# Patient Record
Sex: Male | Born: 1965 | Race: White | Hispanic: No | Marital: Married | Smoking: Never smoker
Health system: Southern US, Community
[De-identification: ages and names within clinical notes are randomized; demographics above are authoritative.]

---

## 2000-07-21 ENCOUNTER — Emergency Department (HOSPITAL_COMMUNITY): Admission: EM | Admit: 2000-07-21 | Discharge: 2000-07-21 | Payer: Self-pay | Admitting: Emergency Medicine

## 2000-07-21 ENCOUNTER — Encounter: Payer: Self-pay | Admitting: Emergency Medicine

## 2000-08-09 ENCOUNTER — Ambulatory Visit (HOSPITAL_COMMUNITY): Admission: RE | Admit: 2000-08-09 | Discharge: 2000-08-09 | Payer: Self-pay | Admitting: Endocrinology

## 2000-08-09 ENCOUNTER — Encounter: Payer: Self-pay | Admitting: Endocrinology

## 2019-05-25 ENCOUNTER — Emergency Department (HOSPITAL_BASED_OUTPATIENT_CLINIC_OR_DEPARTMENT_OTHER)
Admission: EM | Admit: 2019-05-25 | Discharge: 2019-05-25 | Disposition: A | Discharge status: Home / Self Care | Payer: Self-pay | Attending: Emergency Medicine | Admitting: Emergency Medicine

## 2019-05-25 ENCOUNTER — Emergency Department (HOSPITAL_BASED_OUTPATIENT_CLINIC_OR_DEPARTMENT_OTHER): Payer: Self-pay

## 2019-05-25 ENCOUNTER — Other Ambulatory Visit: Payer: Self-pay

## 2019-05-25 ENCOUNTER — Encounter (HOSPITAL_BASED_OUTPATIENT_CLINIC_OR_DEPARTMENT_OTHER): Payer: Self-pay | Admitting: Emergency Medicine

## 2019-05-25 DIAGNOSIS — T7840XA Allergy, unspecified, initial encounter: Secondary | ICD-10-CM | POA: Insufficient documentation

## 2019-05-25 DIAGNOSIS — R072 Precordial pain: Secondary | ICD-10-CM | POA: Insufficient documentation

## 2019-05-25 DIAGNOSIS — Z79899 Other long term (current) drug therapy: Secondary | ICD-10-CM | POA: Insufficient documentation

## 2019-05-25 DIAGNOSIS — R21 Rash and other nonspecific skin eruption: Secondary | ICD-10-CM | POA: Insufficient documentation

## 2019-05-25 LAB — CBC
HCT: 47.8 % (ref 39.0–52.0)
Hemoglobin: 16.1 g/dL (ref 13.0–17.0)
MCH: 30.3 pg (ref 26.0–34.0)
MCHC: 33.7 g/dL (ref 30.0–36.0)
MCV: 90 fL (ref 80.0–100.0)
Platelets: 302 10*3/uL (ref 150–400)
RBC: 5.31 MIL/uL (ref 4.22–5.81)
RDW: 12.1 % (ref 11.5–15.5)
WBC: 7.9 10*3/uL (ref 4.0–10.5)
nRBC: 0 % (ref 0.0–0.2)

## 2019-05-25 LAB — TROPONIN I (HIGH SENSITIVITY)
Troponin I (High Sensitivity): <2 ng/L (ref <18)
Troponin I (High Sensitivity): <2 ng/L (ref <18)

## 2019-05-25 LAB — BASIC METABOLIC PANEL
Anion gap: 9 (ref 5–15)
BUN: 15 mg/dL (ref 6–20)
CO2: 26 mmol/L (ref 22–32)
Calcium: 9.3 mg/dL (ref 8.9–10.3)
Chloride: 101 mmol/L (ref 98–111)
Creatinine, Ser: 0.98 mg/dL (ref 0.61–1.24)
GFR calc Af Amer: >60 mL/min (ref >60)
GFR calc non Af Amer: >60 mL/min (ref >60)
Glucose, Bld: 118 mg/dL — ABNORMAL HIGH (ref 70–99)
Potassium: 4.1 mmol/L (ref 3.5–5.1)
Sodium: 136 mmol/L (ref 135–145)

## 2019-05-25 MED ORDER — OMEPRAZOLE 20 MG PO CPDR: 20.0000 mg | DELAYED_RELEASE_CAPSULE | Freq: Every day | ORAL | 0 refills | Status: AC

## 2019-05-25 NOTE — ED Triage Notes (Signed)
Intermittent pressure to back and chest for "years" but worse the last 2 mornings. Also has hives noted to L arm and back.

## 2019-05-25 NOTE — ED Provider Notes (Signed)
[facility] DEPART[facility] Provider Note   CSN: [id number] Arrival date & time: 05/25/19  9702     History   Chief Complaint Chief Complaint  Patient presents with  . Chest Pain  . Rash    HPI Austin Booth is a 53 y.o. male.  HPI: A 53 year old patient presents for evaluation of chest pain. Initial onset of pain was less than one hour ago. The patient's chest pain is described as heaviness/pressure/tightness and is not worse with exertion. The patient's chest pain is not middle- or left-sided, is not well-localized, is not sharp and does not radiate to the arms/jaw/neck. The patient does not complain of nausea and denies diaphoresis. The patient has no history of stroke, has no history of peripheral artery disease, has not smoked in the past 90 days, denies any history of treated diabetes, has no relevant family history of coronary artery disease (first degree relative at less than age 34), is not hypertensive, has no history of hypercholesterolemia and does not have an elevated BMI (>=30).   Patient also reports intermittent rash over the past month with hives and itching.  No new exposures that the patient can think of.  He states Benadryl makes him too sleepy.  He has not tried any other medications.  He currently is without a primary care physician.  HPI  History reviewed. No pertinent past medical history.  There are no active problems to display for this patient.   Past Surgical History:  Procedure Laterality Date  . TONSILLECTOMY          Home Medications    Prior to Admission medications   Medication Sig Start Date End Date Taking? Authorizing Provider  ALPRAZolam Duanne Moron) 0.25 MG tablet Take by mouth. 04/15/19  Yes [provider]    Family History No family history on file.  Social History Social History   Tobacco Use  . Smoking status: Never Smoker  . Smokeless tobacco: Never Used  Substance Use Topics  . Alcohol use: Yes   Alcohol/week: 4.0 standard drinks    Types: 4 Cans of beer per week    Comment: daily  . Drug use: Not Currently     Allergies   Patient has no known allergies.   Review of Systems Review of Systems  All other systems reviewed and are negative.    Physical Exam Updated Vital Signs BP (!) 168/101 (BP Location: Right Arm)   Pulse 92   Temp 98.3 F (36.8 C) (Oral)   Resp 16   Ht 5\' 7"  (1.702 m)   Wt 77.1 kg   SpO2 98%   BMI 26.63 kg/m   Physical Exam Vitals signs and nursing note reviewed.  Constitutional:      Appearance: He is well-developed.  HENT:     Head: Normocephalic and atraumatic.  Neck:     Musculoskeletal: Normal range of motion.  Cardiovascular:     Rate and Rhythm: Normal rate and regular rhythm.     Heart sounds: Normal heart sounds.  Pulmonary:     Effort: Pulmonary effort is normal. No respiratory distress.     Breath sounds: Normal breath sounds.  Abdominal:     General: There is no distension.     Palpations: Abdomen is soft.     Tenderness: There is no abdominal tenderness.  Musculoskeletal: Normal range of motion.  Skin:    General: Skin is warm and dry.  Neurological:     Mental Status: He is alert and oriented  to person, place, and time.  Psychiatric:        Judgment: Judgment normal.      ED Treatments / Results  Labs (all labs ordered are listed, but only abnormal results are displayed) Labs Reviewed  BASIC METABOLIC PANEL - Abnormal; Notable for the following components:      Result Value   Glucose, Bld 118 (*)    All other components within normal limits  CBC  TROPONIN I (HIGH SENSITIVITY)  TROPONIN I (HIGH SENSITIVITY)    EKG EKG Interpretation  Date/Time:  Sunday May 25 2019 09:31:16 EDT Ventricular Rate:  92 PR Interval:    QRS Duration: 96 QT Interval:  369 QTC Calculation: 457 R Axis:   47 Text Interpretation:  Sinus rhythm Borderline T abnormalities, inferior leads No significant change was found  Confirmed by Azalia Bilisampos, Monika Chestang ([id number]) on 05/25/2019 9:58:55 AM   Radiology Dg Chest 2 View  Result Date: 05/25/2019 CLINICAL DATA:  Chest pain. EXAM: CHEST - 2 VIEW COMPARISON:  None. FINDINGS: The heart size and mediastinal contours are within normal limits. Both lungs are clear. No pneumothorax or pleural effusion is noted. The visualized skeletal structures are unremarkable. IMPRESSION: No active cardiopulmonary disease. Electronically Signed   By: Lupita RaiderJames  Green Jr M.D.   On: 05/25/2019 09:53    Procedures Procedures (including critical care time)  Medications Ordered in ED Medications - No data to display   Initial Impression / Assessment and Plan / ED Course  I have reviewed the triage vital signs and the nursing notes.  Pertinent labs & imaging results that were available during my care of the patient were reviewed by me and considered in my medical decision making (see chart for details).     HEAR Score: 1  Low suspicion for ACS.  Heart pathway utilized.  Stable for discharge with primary care follow-up.  Likely gastroesophageal reflux disease with associated esophageal spasm.  Patient be placed on daily Prilosec.  He understands return to the ER for new or worsening  symptoms  He has had intermittent rash for the past month.  I recommended follow-up with a primary care physician and allergist.  In the interim I recommended Pepcid, Benadryl, Claritin.  Final Clinical Impressions(s) / ED Diagnoses   Final diagnoses:  None    ED Discharge Orders    None       Azalia Bilisampos, Rielynn Trulson, MD 05/25/19 1210

## 2020-09-01 IMAGING — CR CHEST - 2 VIEW
2 series · 2 of 2 positions shown · non-contrast
Comparison: None.

CLINICAL DATA: Chest pain.

EXAM:
CHEST - 2 VIEW

[w chest pa]
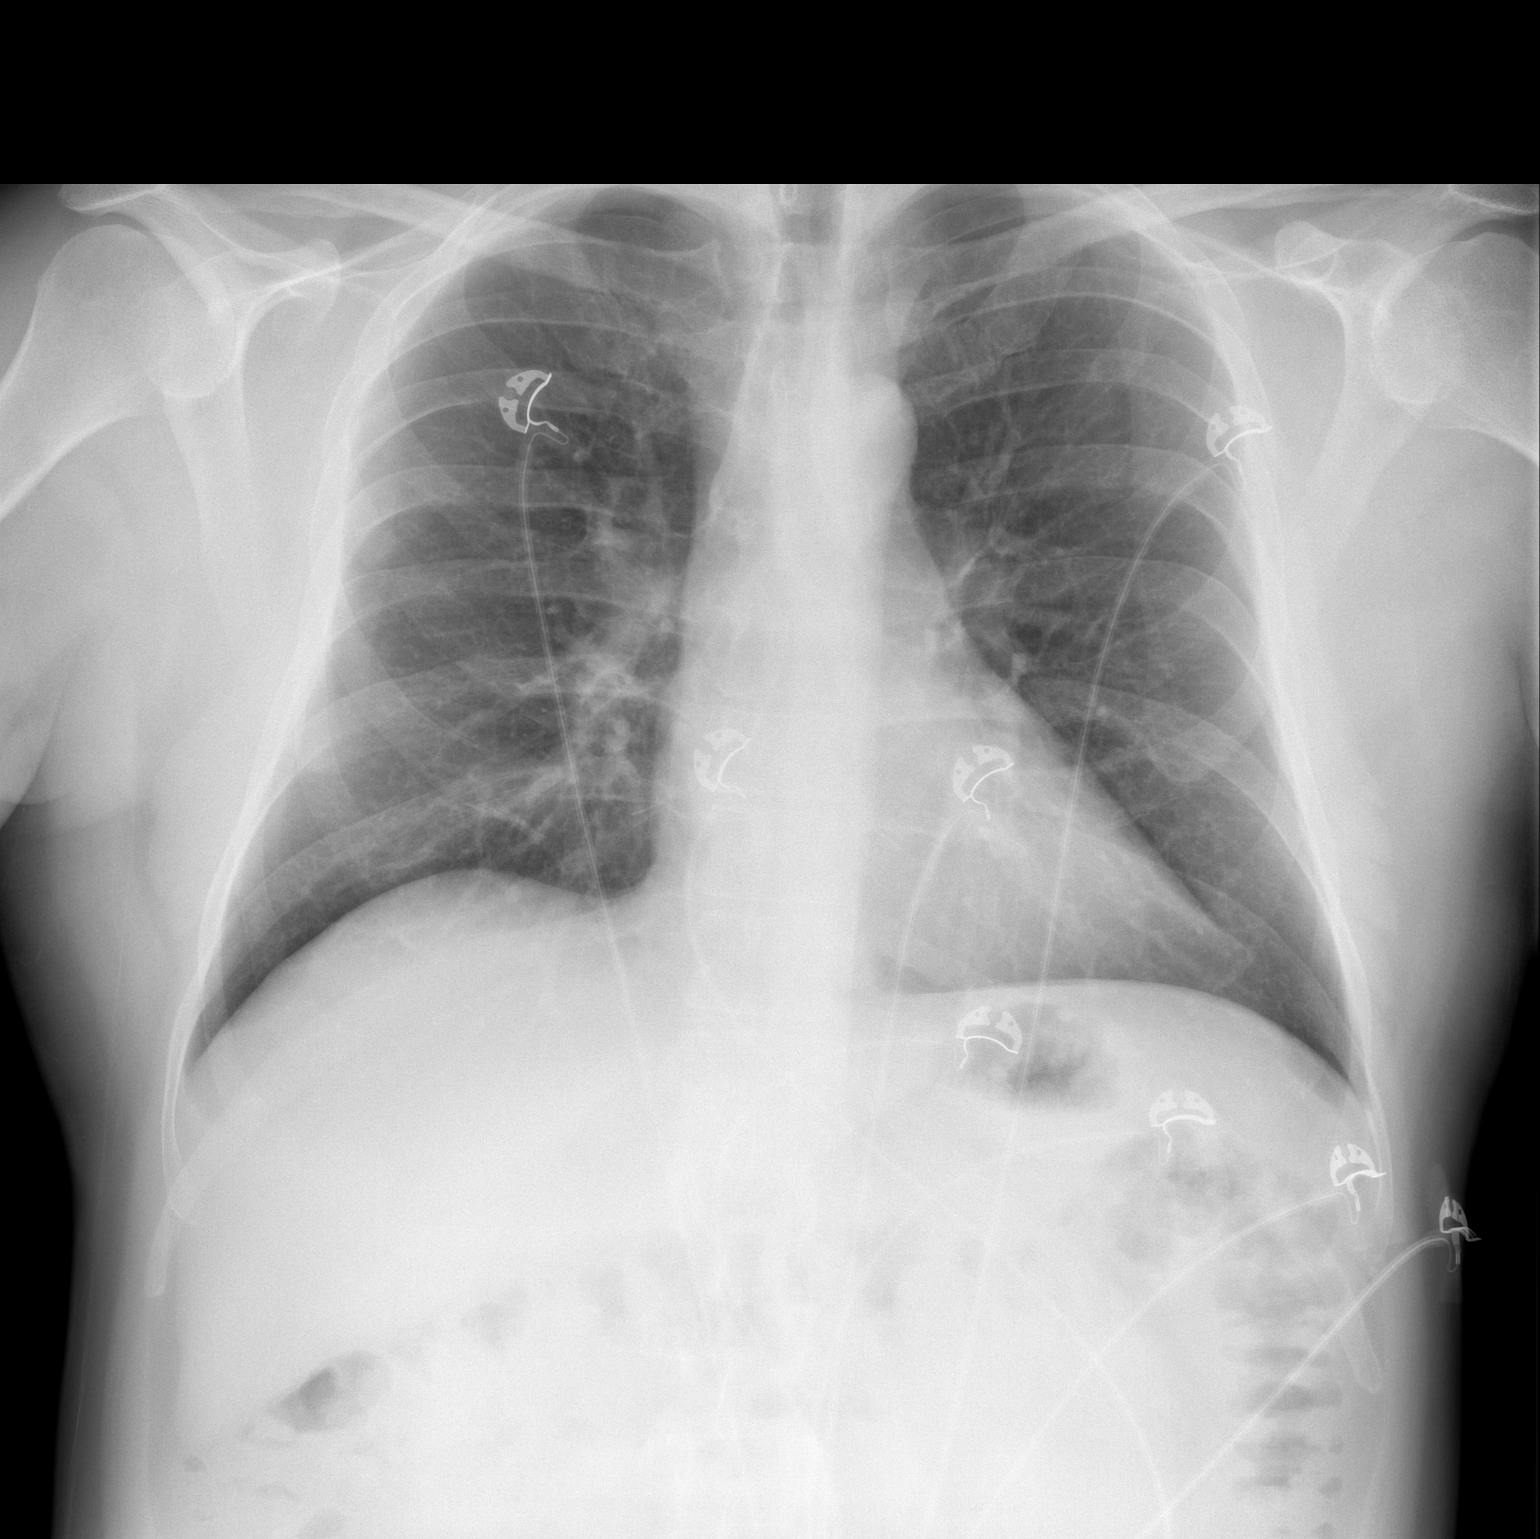

[w chest lat]
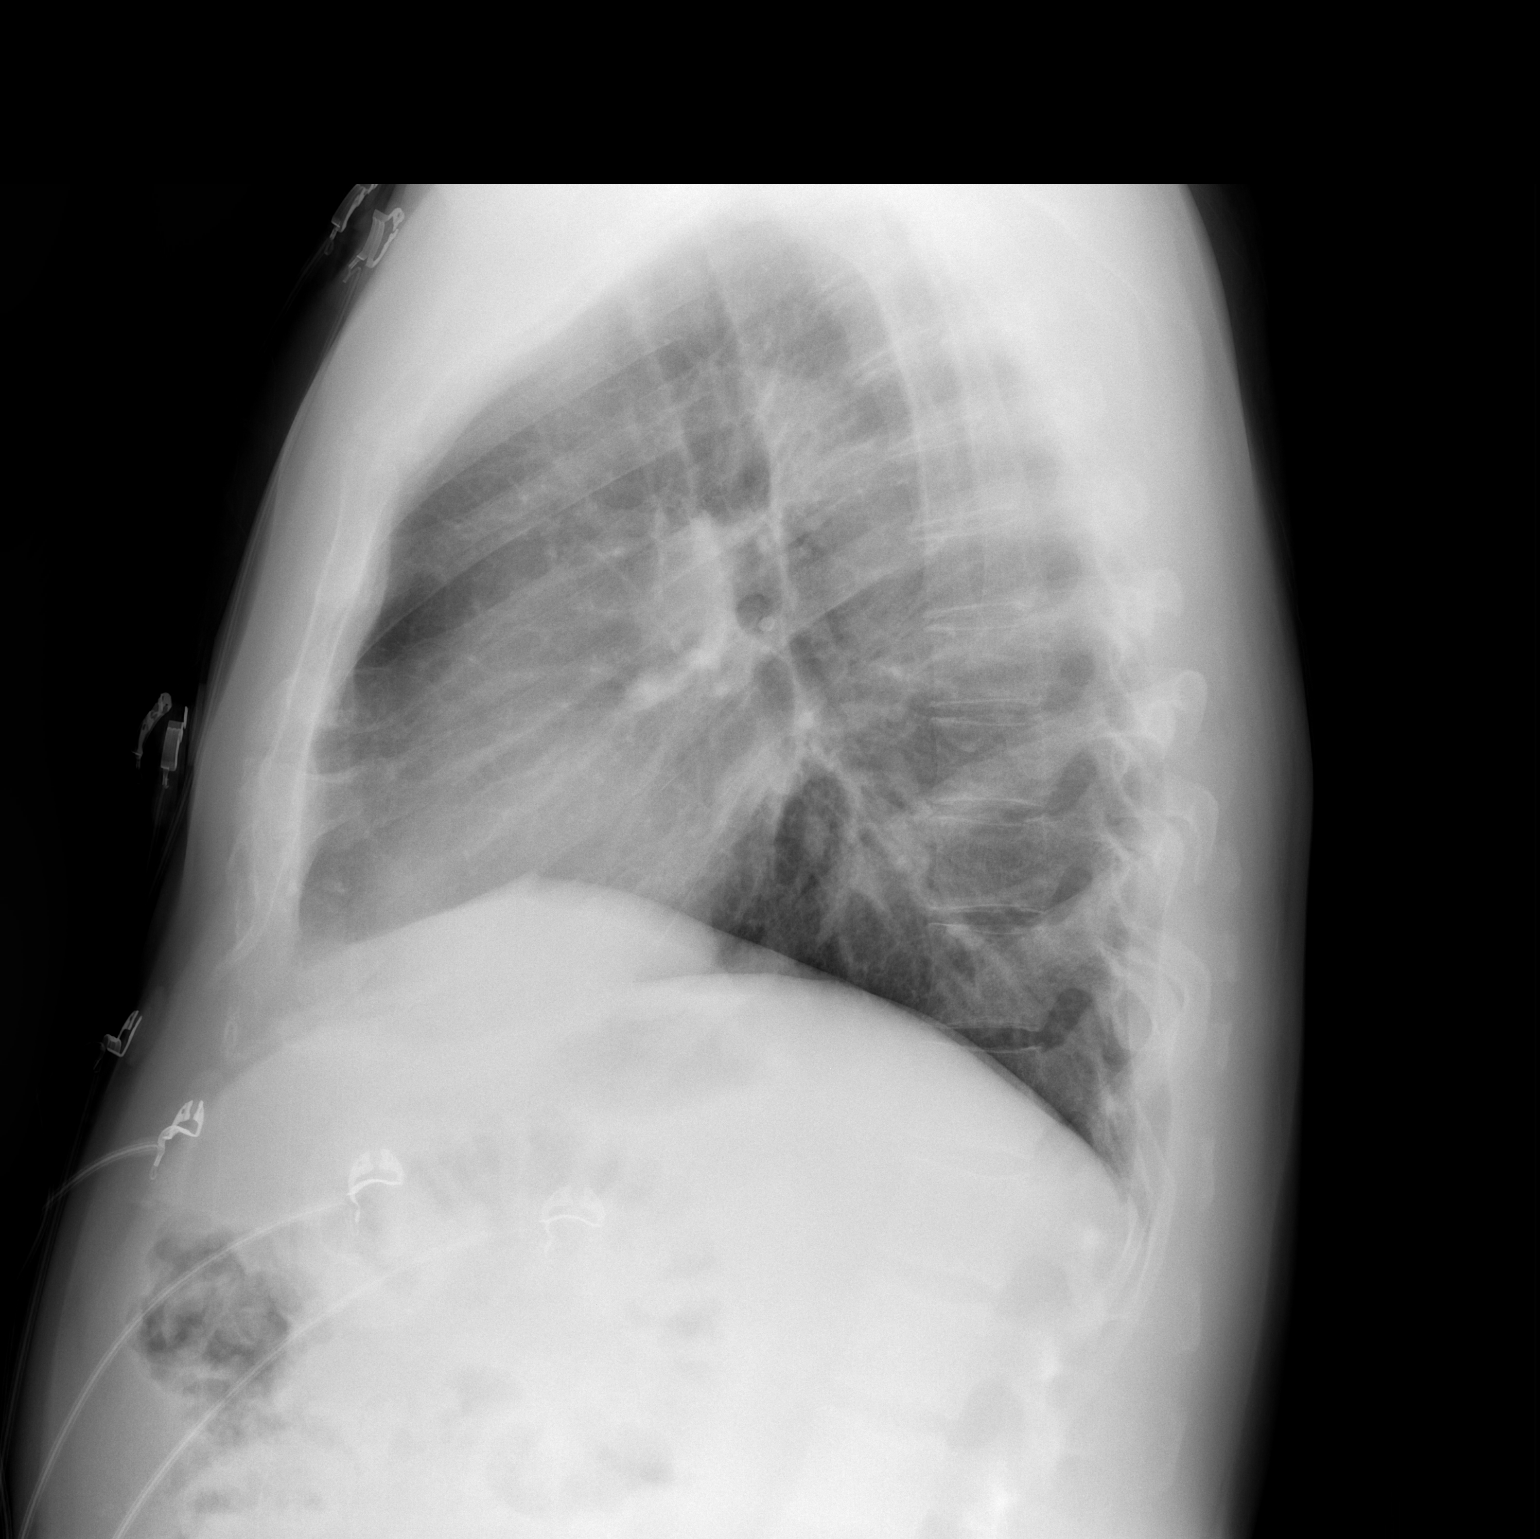

[2 of 2 positions shown; findings below may reference images not displayed]

FINDINGS: The heart size and mediastinal contours are within normal limits.
Both lungs are clear. No pneumothorax or pleural effusion is noted.
The visualized skeletal structures are unremarkable.
IMPRESSION: No active cardiopulmonary disease.
